# Patient Record
Sex: Male | Born: 2000 | Race: White | Hispanic: No | Marital: Single | Smoking: Never smoker
Health system: Southern US, Community
[De-identification: ages and names within clinical notes are randomized; demographics above are authoritative.]

## PROBLEM LIST (undated history)

## (undated) DIAGNOSIS — J45909 Unspecified asthma, uncomplicated: Secondary | ICD-10-CM

---
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
## 2020-01-12 ENCOUNTER — Ambulatory Visit: Payer: Self-pay | Attending: Internal Medicine

## 2020-01-12 DIAGNOSIS — Z23 Encounter for immunization: Secondary | ICD-10-CM

## 2020-01-12 NOTE — Progress Notes (Signed)
   Covid-19 Vaccination Clinic  Name:  Edwin Li    MRN: [id number] DOB: Jul 08, 2001  01/12/2020  Mr. Edwin Li was observed post Covid-19 immunization for 15 minutes without incident. He was provided with Vaccine Information Sheet and instruction to access the V-Safe system.   Mr. Edwin Li was instructed to call 911 with any severe reactions post vaccine: Marland Kitchen Difficulty breathing  . Swelling of face and throat  . A fast heartbeat  . A bad rash all over body  . Dizziness and weakness   Immunizations Administered    Name Date Dose VIS Date Route   Pfizer COVID-19 Vaccine 01/12/2020  5:59 PM 0.3 mL 09/29/2019 Intramuscular   Manufacturer: [employer], [employer]   Lot: [id number]   NDC: [address]-8

## 2020-02-02 ENCOUNTER — Ambulatory Visit: Payer: Self-pay | Attending: Internal Medicine

## 2020-02-02 DIAGNOSIS — Z23 Encounter for immunization: Secondary | ICD-10-CM

## 2020-02-02 NOTE — Progress Notes (Signed)
   Covid-19 Vaccination Clinic  Name:  JAVARI BUFKIN    MRN: [id number] DOB: 05/27/2001  02/02/2020  Mr. Segundo was observed post Covid-19 immunization for 15 minutes without incident. He was provided with Vaccine Information Sheet and instruction to access the V-Safe system.   Mr. Cato was instructed to call 911 with any severe reactions post vaccine: Marland Kitchen Difficulty breathing  . Swelling of face and throat  . A fast heartbeat  . A bad rash all over body  . Dizziness and weakness   Immunizations Administered    Name Date Dose VIS Date Route   Pfizer COVID-19 Vaccine 02/02/2020  4:47 PM 0.3 mL 09/29/2019 Intramuscular   Manufacturer: [employer], [employer]   Lot: [id number]   NDC: [address]-7

## 2022-09-18 ENCOUNTER — Emergency Department: Payer: 59

## 2022-09-18 ENCOUNTER — Other Ambulatory Visit: Payer: Self-pay

## 2022-09-18 ENCOUNTER — Emergency Department
Admission: EM | Admit: 2022-09-18 | Discharge: 2022-09-18 | Disposition: A | Discharge status: Home / Self Care | Payer: 59 | Attending: Emergency Medicine | Admitting: Emergency Medicine

## 2022-09-18 DIAGNOSIS — X501XXA Overexertion from prolonged static or awkward postures, initial encounter: Secondary | ICD-10-CM | POA: Diagnosis not present

## 2022-09-18 DIAGNOSIS — J45909 Unspecified asthma, uncomplicated: Secondary | ICD-10-CM | POA: Insufficient documentation

## 2022-09-18 DIAGNOSIS — S93402A Sprain of unspecified ligament of left ankle, initial encounter: Secondary | ICD-10-CM | POA: Diagnosis not present

## 2022-09-18 DIAGNOSIS — Y9367 Activity, basketball: Secondary | ICD-10-CM | POA: Insufficient documentation

## 2022-09-18 DIAGNOSIS — S99912A Unspecified injury of left ankle, initial encounter: Secondary | ICD-10-CM | POA: Diagnosis present

## 2022-09-18 MED ORDER — IBUPROFEN 400 MG PO TABS
400.0000 mg | ORAL_TABLET | Freq: Once | ORAL | Status: DC
  Filled 2022-09-18: qty 1

## 2022-09-18 NOTE — ED Provider Notes (Signed)
[facility] Provider Note    Event Date/Time   First MD Initiated Contact with Patient 09/18/22 0413     (approximate)   History   Ankle Pain   HPI  Leray EMMET MESSER is a 21 y.o. male past medical history of asthma presents with left ankle injury.  Patient was playing basketball yesterday and twisted the ankle.  Initially was pain-free and able to ambulate but overnight started having worsening pain.  Took ibuprofen for it.  Still able to ambulate.  Denies numbness tingling weakness no other injury.     Past Medical History:  Diagnosis Date   Asthma     There are no problems to display for this patient.    Physical Exam  Triage Vital Signs: ED Triage Vitals  Enc Vitals Group     BP 09/18/22 0053 123/72     Pulse Rate 09/18/22 0053 100     Resp 09/18/22 0053 14     Temp 09/18/22 0053 98.4 F (36.9 C)     Temp Source 09/18/22 0053 Oral     SpO2 09/18/22 0053 100 %     Weight 09/18/22 0049 235 lb (106.6 kg)     Height 09/18/22 0049 5\' 10"  (1.778 m)     Head Circumference --      Peak Flow --      Pain Score 09/18/22 0049 6     Pain Loc --      Pain Edu? --      Excl. in GC? --     Most recent vital signs: Vitals:   09/18/22 0053 09/18/22 0529  BP: 123/72 119/69  Pulse: 100 85  Resp: 14 16  Temp: 98.4 F (36.9 C)   SpO2: 100% 99%     General: Awake, no distress.  CV:  Good peripheral perfusion.  Resp:  Normal effort.  Abd:  No distention.  Neuro:             Awake, Alert, Oriented x 3  Other:  No significant swelling or deformity of the left ankle, no bony tenderness, able to range, 2+ DP pulse   ED Results / Procedures / Treatments  Labs (all labs ordered are listed, but only abnormal results are displayed) Labs Reviewed - No data to display   EKG     RADIOLOGY Reviewed and interpreted the left ankle x-ray this is negative for fracture   PROCEDURES:  Critical Care performed: No  Procedures   MEDICATIONS  ORDERED IN ED: Medications  ibuprofen (ADVIL) tablet 400 mg (400 mg Oral Patient Refused/Not Given 09/18/22 0531)     IMPRESSION / MDM / ASSESSMENT AND PLAN / ED COURSE  I reviewed the triage vital signs and the nursing notes.                              Patient's presentation is most consistent with acute, uncomplicated illness.  Differential diagnosis includes, but is not limited to, ankle sprain, ankle fracture, ligamentous injury  Patient is a 21 year old male presents with a left ankle injury.  Twisted left ankle when playing basketball yesterday.  Initially was able to ambulate now having worsening pain.  Exam is reassuring there is no focal bony tenderness no swelling or deformities able to range it he is neurovascular intact.  X-ray obtained is negative for fracture.  Since that ankle sprain.  Discussed RICE.  He is appropriate for discharge.  FINAL CLINICAL IMPRESSION(S) / ED DIAGNOSES   Final diagnoses:  Sprain of left ankle, unspecified ligament, initial encounter     Rx / DC Orders   ED Discharge Orders     None        Note:  This document was prepared using Dragon voice recognition software and may include unintentional dictation errors.   Georga Hacking, MD 09/18/22 [phone]

## 2022-09-18 NOTE — ED Triage Notes (Signed)
Pt comes from home via POV c/o left ankle injury. Pt states he twisted it while playing basketball today. Left ankle is swollen. NAD at this time.

## 2022-11-20 ENCOUNTER — Encounter: Payer: Self-pay | Admitting: Internal Medicine

## 2022-11-23 ENCOUNTER — Other Ambulatory Visit: Payer: Self-pay | Admitting: Internal Medicine

## 2022-11-23 DIAGNOSIS — R748 Abnormal levels of other serum enzymes: Secondary | ICD-10-CM

## 2022-11-25 ENCOUNTER — Other Ambulatory Visit: Payer: 59

## 2022-12-02 ENCOUNTER — Other Ambulatory Visit: Payer: 59

## 2022-12-02 ENCOUNTER — Ambulatory Visit (INDEPENDENT_AMBULATORY_CARE_PROVIDER_SITE_OTHER): Payer: 59

## 2022-12-02 DIAGNOSIS — R748 Abnormal levels of other serum enzymes: Secondary | ICD-10-CM | POA: Diagnosis not present

## 2023-02-15 ENCOUNTER — Ambulatory Visit: Payer: 59 | Admitting: Internal Medicine

## 2023-02-15 ENCOUNTER — Encounter: Payer: Self-pay | Admitting: Internal Medicine

## 2023-02-15 ENCOUNTER — Other Ambulatory Visit: Payer: Self-pay | Admitting: Internal Medicine

## 2023-02-15 VITALS — BP 114/82 | HR 113 | Ht 70.0 in | Wt 244.0 lb

## 2023-02-15 DIAGNOSIS — E782 Mixed hyperlipidemia: Secondary | ICD-10-CM | POA: Diagnosis not present

## 2023-02-15 DIAGNOSIS — E6609 Other obesity due to excess calories: Secondary | ICD-10-CM | POA: Insufficient documentation

## 2023-02-15 DIAGNOSIS — Z6836 Body mass index (BMI) 36.0-36.9, adult: Secondary | ICD-10-CM

## 2023-02-15 DIAGNOSIS — R748 Abnormal levels of other serum enzymes: Secondary | ICD-10-CM | POA: Insufficient documentation

## 2023-02-15 NOTE — Progress Notes (Signed)
Established Patient Office Visit  Subjective:  Patient ID: Edwin Li, male    DOB: Mar 14, 2001  Age: 22 y.o. MRN: [id number]  Chief Complaint  Patient presents with   Follow-up    3 month follow up, discuss lab results.    No new complaints, here for lab review  but failed to have previsit labs done.      No other concerns at this time.   Past Medical History:  Diagnosis Date   Asthma     No past surgical history on file.  Social History   Socioeconomic History   Marital status: Single    Spouse name: Not on file   Number of children: Not on file   Years of education: Not on file   Highest education level: Not on file  Occupational History   Not on file  Tobacco Use   Smoking status: Never   Smokeless tobacco: Not on file  Substance and Sexual Activity   Alcohol use: No   Drug use: Not on file   Sexual activity: Not on file  Other Topics Concern   Not on file  Social History Narrative   Not on file   Social Determinants of Health   Financial Resource Strain: Not on file  Food Insecurity: Not on file  Transportation Needs: Not on file  Physical Activity: Not on file  Stress: Not on file  Social Connections: Not on file  Intimate Partner Violence: Not on file    No family history on file.  No Known Allergies  Review of Systems  Constitutional: Negative.   HENT: Negative.    Eyes: Negative.   Respiratory: Negative.    Cardiovascular: Negative.   Gastrointestinal: Negative.   Genitourinary: Negative.   Skin: Negative.   Neurological: Negative.   Endo/Heme/Allergies: Negative.        Objective:   BP 114/82   Pulse (!) 113   Ht 5\' 10"  (1.778 m)   Wt 244 lb (110.7 kg)   SpO2 97%   BMI 35.01 kg/m   Vitals:   02/15/23 1108  BP: 114/82  Pulse: (!) 113  Height: 5\' 10"  (1.778 m)  Weight: 244 lb (110.7 kg)  SpO2: 97%  BMI (Calculated): 35.01    Physical Exam Vitals reviewed.  Constitutional:      Appearance: Normal  appearance. He is obese.  HENT:     Head: Normocephalic.     Left Ear: There is no impacted cerumen.     Nose: Nose normal.     Mouth/Throat:     Mouth: Mucous membranes are moist.     Pharynx: No posterior oropharyngeal erythema.  Eyes:     Extraocular Movements: Extraocular movements intact.     Pupils: Pupils are equal, round, and reactive to light.  Cardiovascular:     Rate and Rhythm: Regular rhythm. Tachycardia present.     Chest Wall: PMI is not displaced.     Pulses: Normal pulses.     Heart sounds: Normal heart sounds. No murmur heard. Pulmonary:     Effort: Pulmonary effort is normal.     Breath sounds: Normal air entry. No rhonchi or rales.  Abdominal:     General: Abdomen is flat. Bowel sounds are normal. There is no distension.     Palpations: Abdomen is soft. There is no hepatomegaly, splenomegaly or mass.     Tenderness: There is no abdominal tenderness.  Musculoskeletal:        General: Normal range of motion.  Cervical back: Normal range of motion and neck supple.     Right lower leg: No edema.     Left lower leg: No edema.  Skin:    General: Skin is warm and dry.  Neurological:     General: No focal deficit present.     Mental Status: He is alert and oriented to person, place, and time.     Cranial Nerves: No cranial nerve deficit.     Motor: No weakness.  Psychiatric:        Mood and Affect: Mood normal.        Behavior: Behavior normal.      No results found for any visits on 02/15/23.  No results found for this or any previous visit (from the past 2160 hour(Niccolas Loeper)).    Assessment & Plan:   Problem List Items Addressed This Visit   None   No follow-ups on file.   Total time spent: 20 minutes  Luna Fuse, MD  02/15/2023

## 2023-02-15 NOTE — Addendum Note (Signed)
Addended by: Feliberto Harts on: 02/15/2023 11:55 AM   Modules accepted: Orders

## 2023-02-16 LAB — HEPATIC FUNCTION PANEL
ALT: 45 IU/L — ABNORMAL HIGH (ref 0–44)
AST: 30 IU/L (ref 0–40)
Albumin: 4.9 g/dL (ref 4.3–5.2)
Alkaline Phosphatase: 145 IU/L — ABNORMAL HIGH (ref 44–121)
Bilirubin Total: 0.5 mg/dL (ref 0.0–1.2)
Bilirubin, Direct: 0.13 mg/dL (ref 0.00–0.40)
Total Protein: 7.6 g/dL (ref 6.0–8.5)

## 2023-02-16 LAB — FERRITIN: Ferritin: 104 ng/mL (ref 30–400)

## 2023-02-16 LAB — CERULOPLASMIN: Ceruloplasmin: 24.6 mg/dL (ref 16.0–31.0)

## 2023-04-03 ENCOUNTER — Emergency Department
Admission: EM | Admit: 2023-04-03 | Discharge: 2023-04-03 | Disposition: A | Discharge status: Home / Self Care | Payer: 59 | Attending: Emergency Medicine | Admitting: Emergency Medicine

## 2023-04-03 ENCOUNTER — Other Ambulatory Visit: Payer: Self-pay

## 2023-04-03 DIAGNOSIS — S61211A Laceration without foreign body of left index finger without damage to nail, initial encounter: Secondary | ICD-10-CM

## 2023-04-03 DIAGNOSIS — S6992XA Unspecified injury of left wrist, hand and finger(s), initial encounter: Secondary | ICD-10-CM | POA: Diagnosis present

## 2023-04-03 DIAGNOSIS — Z23 Encounter for immunization: Secondary | ICD-10-CM | POA: Diagnosis not present

## 2023-04-03 DIAGNOSIS — W293XXA Contact with powered garden and outdoor hand tools and machinery, initial encounter: Secondary | ICD-10-CM | POA: Insufficient documentation

## 2023-04-03 MED ORDER — TETANUS-DIPHTH-ACELL PERTUSSIS 5-2.5-18.5 LF-MCG/0.5 IM SUSY
0.5000 mL | PREFILLED_SYRINGE | Freq: Once | INTRAMUSCULAR | Status: AC
  Administered 2023-04-03: 0.5 mL via INTRAMUSCULAR
  Filled 2023-04-03: qty 0.5

## 2023-04-03 MED ORDER — LIDOCAINE HCL (PF) 1 % IJ SOLN
5.0000 mL | Freq: Once | INTRAMUSCULAR | Status: AC
  Administered 2023-04-03: 5 mL
  Filled 2023-04-03: qty 5

## 2023-04-03 NOTE — ED Triage Notes (Signed)
Patient states he cut left index finger with hedge trimmers

## 2023-04-03 NOTE — Discharge Instructions (Signed)
You were evaluated in the emergency department for a laceration. It was repaired with sutures. Keep the area clean and dry.  Wash multiple times per day with soap and water.  Wear the splint during the day, and remove it when you wash your hands so it doesn't get wet. Do not go into the ocean or swimming pool.  Return to the emergency department or your primary care physician's office in 7 days for suture removal.  You can keep the wound undressed when you are hanging out in a clean environment. Return to the emergency department for:  -- Fever > 100.58F -- Increase pain in the wound -- Increase redness and swelling -- Pus coming from the wound -- Wound bleeds more than a small amount or it does not stop -- Wound edges come apart -- Severe pain -- Weakness or numbness in the affected area  Or any other new or worsening symptoms. It was a pleasure caring for you.

## 2023-04-03 NOTE — ED Provider Notes (Signed)
[facility] Provider Note    Event Date/Time   First MD Initiated Contact with Patient 04/03/23 1124     (approximate)   History   Extremity Laceration   HPI  Edwin Li is a 22 y.o. male with a past medical history of obesity who presents today for evaluation of the left index finger laceration.  Patient reports that he accidentally cut himself with hedge trimmers.  He denies numbness or tingling.  He is able to move his finger normally.  He is unsure of his last tetanus shot.  Patient Active Problem List   Diagnosis Date Noted   Abnormal liver enzymes 02/15/2023   Class 2 obesity due to excess calories without serious comorbidity with body mass index (BMI) of 36.0 to 36.9 in adult 02/15/2023          Physical Exam   Triage Vital Signs: ED Triage Vitals  Enc Vitals Group     BP 04/03/23 1122 (!) 159/121     Pulse Rate 04/03/23 1122 94     Resp 04/03/23 1122 20     Temp 04/03/23 1122 98.2 F (36.8 C)     Temp Source 04/03/23 1122 Oral     SpO2 04/03/23 1122 97 %     Weight 04/03/23 1121 250 lb (113.4 kg)     Height 04/03/23 1121 5\' 10"  (1.778 m)     Head Circumference --      Peak Flow --      Pain Score 04/03/23 1121 10     Pain Loc --      Pain Edu? --      Excl. in GC? --     Most recent vital signs: Vitals:   04/03/23 1122  BP: (!) 159/121  Pulse: 94  Resp: 20  Temp: 98.2 F (36.8 C)  SpO2: 97%    Physical Exam Vitals and nursing note reviewed.  Constitutional:      General: Awake and alert. No acute distress.    Appearance: Normal appearance. The patient is normal weight.  HENT:     Head: Normocephalic and atraumatic.     Mouth: Mucous membranes are moist.  Eyes:     General: PERRL. Normal EOMs        Right eye: No discharge.        Left eye: No discharge.     Conjunctiva/sclera: Conjunctivae normal.  Cardiovascular:     Rate and Rhythm: Normal rate and regular rhythm.     Pulses: Normal pulses.  Pulmonary:      Effort: Pulmonary effort is normal. No respiratory distress.     Breath sounds: Normal breath sounds.  Abdominal:     Abdomen is soft. There is no abdominal tenderness. No rebound or guarding. No distention. Musculoskeletal:        General: No swelling. Normal range of motion.     Cervical back: Normal range of motion and neck supple.  Left index finger with V-shaped laceration to the side of the finger with skin flap at the level of the DIP.  He is able to flex and extend at isolated DIP and PIP against resistance.  Sensation intact light touch throughout.  Normal capillary refill.  No retained foreign body noted. Skin:    General: Skin is warm and dry.     Capillary Refill: Capillary refill takes less than 2 seconds.     Findings: No rash.  Neurological:     Mental Status: The patient is  awake and alert.      ED Results / Procedures / Treatments   Labs (all labs ordered are listed, but only abnormal results are displayed) Labs Reviewed - No data to display   EKG     RADIOLOGY     PROCEDURES:  Critical Care performed:   Marland KitchenMarland KitchenLaceration Repair  Date/Time: 04/03/2023 12:19 PM  Performed by: Jackelyn Hoehn, PA-C Authorized by: Jackelyn Hoehn, PA-C   Consent:    Consent obtained:  Verbal   Consent given by:  Patient   Risks, benefits, and alternatives were discussed: yes     Risks discussed:  Need for additional repair, infection, nerve damage, pain, poor cosmetic result, poor wound healing, retained foreign body, tendon damage and vascular damage   Alternatives discussed:  No treatment Universal protocol:    Procedure explained and questions answered to patient or proxy's satisfaction: yes     Relevant documents present and verified: yes     Test results available: yes     Required blood products, implants, devices, and special equipment available: yes     Site/side marked: yes     Immediately prior to procedure, a time out was called: yes     Patient identity  confirmed:  Verbally with patient Anesthesia:    Anesthesia method:  Nerve block   Block needle gauge:  27 G   Block anesthetic:  Lidocaine 1% w/o epi   Block injection procedure:  Anatomic landmarks identified, anatomic landmarks palpated, introduced needle, negative aspiration for blood and incremental injection   Block outcome:  Anesthesia achieved Laceration details:    Location:  Finger   Finger location:  L index finger   Length (cm):  1.5 (with flap)   Depth (mm):  3 Pre-procedure details:    Preparation:  Patient was prepped and draped in usual sterile fashion Exploration:    Limited defect created (wound extended): no     Hemostasis achieved with:  Direct pressure   Imaging outcome: foreign body not noted     Wound exploration: wound explored through full range of motion and entire depth of wound visualized     Wound extent: areolar tissue not violated, fascia not violated, no foreign body, no signs of injury, no nerve damage, no tendon damage, no underlying fracture and no vascular damage     Contaminated: no   Treatment:    Area cleansed with:  Saline and soap and water   Amount of cleaning:  Extensive   Irrigation solution:  Sterile saline and tap water   Irrigation method:  Pressure wash, syringe and tap   Debridement:  None   Undermining:  None   Scar revision: no   Skin repair:    Repair method:  Sutures   Suture size:  5-0   Suture material:  Nylon   Suture technique:  Simple interrupted   Number of sutures:  4 Repair type:    Repair type:  Intermediate Post-procedure details:    Dressing:  Non-adherent dressing and splint for protection   Procedure completion:  Tolerated with difficulty    MEDICATIONS ORDERED IN ED: Medications  Tdap (BOOSTRIX) injection 0.5 mL (0.5 mLs Intramuscular Given 04/03/23 1141)  lidocaine (PF) (XYLOCAINE) 1 % injection 5 mL (5 mLs Infiltration Given 04/03/23 1147)     IMPRESSION / MDM / ASSESSMENT AND PLAN / ED COURSE  I  reviewed the triage vital signs and the nursing notes.   Differential diagnosis includes, but is not limited to, finger laceration,  tendon injury, retained foreign body.  Patient is awake and alert, hemodynamically stable and afebrile, though quite anxious in appearance.  Patient is neurovascularly intact.  Sensation intact to light touch throughout finger, do not suspect nerve injury.  Able to flex and extend at isolated DIP and PIP, do not suspect tendon injury.  No fingernail or nailbed involvement. Wound fully probed and evaluated, no retained foreign body.  He has no bony tenderness or swelling or ecchymosis to suggest fracture.  Wound was anesthetized and irrigated, closed with sutures.  Patient required multiple breaks in order to de-escalate his anxiety regarding sutures and needles.  We discussed suture care and timeline for removal.  Tdap was updated.  I discussed return precautions with patient and his significant other.  Patient was discharged in stable condition.   Patient's presentation is most consistent with acute illness / injury with system symptoms.      FINAL CLINICAL IMPRESSION(S) / ED DIAGNOSES   Final diagnoses:  Laceration of left index finger without foreign body without damage to nail, initial encounter     Rx / DC Orders   ED Discharge Orders     None        Note:  This document was prepared using Dragon voice recognition software and may include unintentional dictation errors.   Keturah Shavers 04/03/23 1305    Georga Hacking, MD 04/03/23 1620

## 2023-05-14 ENCOUNTER — Ambulatory Visit: Payer: 59 | Admitting: Internal Medicine

## 2023-05-14 VITALS — BP 150/80 | HR 84 | Ht 70.0 in | Wt 248.4 lb

## 2023-05-14 DIAGNOSIS — N644 Mastodynia: Secondary | ICD-10-CM

## 2023-05-14 DIAGNOSIS — E6609 Other obesity due to excess calories: Secondary | ICD-10-CM

## 2023-05-14 DIAGNOSIS — Z6836 Body mass index (BMI) 36.0-36.9, adult: Secondary | ICD-10-CM | POA: Diagnosis not present

## 2023-05-14 DIAGNOSIS — E782 Mixed hyperlipidemia: Secondary | ICD-10-CM

## 2023-05-14 NOTE — Progress Notes (Signed)
   Established Patient Office Visit  Subjective:  Patient ID: Edwin Li, male    DOB: [birth year]  Age: 22 y.o. MRN: [id number]  Chief Complaint  Patient presents with   Acute Visit    Pain right nipple    C/o pain in his right nipple x 1 mo, denies any pain or known trauma.    No other concerns at this time.   Past Medical History:  Diagnosis Date   Asthma     No past surgical history on file.  Social History   Socioeconomic History   Marital status: Single    Spouse name: Not on file   Number of children: Not on file   Years of education: Not on file   Highest education level: Not on file  Occupational History   Not on file  Tobacco Use   Smoking status: Never   Smokeless tobacco: Not on file  Substance and Sexual Activity   Alcohol use: No   Drug use: Not on file   Sexual activity: Not on file  Other Topics Concern   Not on file  Social History Narrative   Not on file   Social Determinants of Health   Financial Resource Strain: Not on file  Food Insecurity: Not on file  Transportation Needs: Not on file  Physical Activity: Not on file  Stress: Not on file  Social Connections: Not on file  Intimate Partner Violence: Not on file    No family history on file.  No Known Allergies  Review of Systems  Constitutional: Negative.   HENT: Negative.    Eyes: Negative.   Respiratory: Negative.    Cardiovascular: Negative.   Gastrointestinal: Negative.   Genitourinary: Negative.   Skin: Negative.   Neurological: Negative.   Endo/Heme/Allergies: Negative.        Objective:   BP (!) 150/80   Pulse 84   Ht 5\' 10"  (1.778 m)   Wt 248 lb 6.4 oz (112.7 kg)   SpO2 98%   BMI 35.64 kg/m   Vitals:   05/14/23 0924  BP: (!) 150/80  Pulse: 84  Height: 5\' 10"  (1.778 m)  Weight: 248 lb 6.4 oz (112.7 kg)  SpO2: 98%  BMI (Calculated): 35.64    Physical Exam Chest:  Breasts:    Right: Tenderness (medial periareolar area) present. No skin change  (no erythema).     Left: No skin change.      No results found for any visits on 05/14/23.      Assessment & Plan:  As per problem list  Problem List Items Addressed This Visit       Other   Class 2 obesity due to excess calories without serious comorbidity with body mass index (BMI) of 36.0 to 36.9 in adult   Other Visit Diagnoses     Mastalgia    -  Primary   Relevant Orders   Testosterone Free with SHBG   LH   FSH   TSH   Mixed hyperlipidemia           Return in about 2 weeks (around 05/28/2023) for fu with labs prior.   Total time spent: 30 minutes  Luna Fuse, MD  05/14/2023   This document may have been prepared by [facility] Voice Recognition software and as such may include unintentional dictation errors.

## 2023-05-17 ENCOUNTER — Ambulatory Visit: Payer: 59 | Admitting: Internal Medicine

## 2023-07-20 ENCOUNTER — Ambulatory Visit: Payer: 59 | Admitting: Internal Medicine

## 2023-07-20 ENCOUNTER — Other Ambulatory Visit: Payer: 59

## 2023-07-20 DIAGNOSIS — N644 Mastodynia: Secondary | ICD-10-CM

## 2023-07-21 ENCOUNTER — Other Ambulatory Visit: Payer: Self-pay | Admitting: Internal Medicine

## 2023-07-21 ENCOUNTER — Ambulatory Visit (INDEPENDENT_AMBULATORY_CARE_PROVIDER_SITE_OTHER): Payer: 59 | Admitting: Internal Medicine

## 2023-07-21 ENCOUNTER — Encounter: Payer: Self-pay | Admitting: Internal Medicine

## 2023-07-21 VITALS — BP 140/90 | HR 79 | Ht 70.0 in | Wt 252.0 lb

## 2023-07-21 DIAGNOSIS — R928 Other abnormal and inconclusive findings on diagnostic imaging of breast: Secondary | ICD-10-CM

## 2023-07-21 DIAGNOSIS — N644 Mastodynia: Secondary | ICD-10-CM | POA: Diagnosis not present

## 2023-07-21 DIAGNOSIS — E66812 Obesity, class 2: Secondary | ICD-10-CM | POA: Diagnosis not present

## 2023-07-21 DIAGNOSIS — H918X1 Other specified hearing loss, right ear: Secondary | ICD-10-CM

## 2023-07-21 DIAGNOSIS — H9190 Unspecified hearing loss, unspecified ear: Secondary | ICD-10-CM | POA: Insufficient documentation

## 2023-07-21 DIAGNOSIS — Z6836 Body mass index (BMI) 36.0-36.9, adult: Secondary | ICD-10-CM | POA: Diagnosis not present

## 2023-07-21 DIAGNOSIS — E6609 Other obesity due to excess calories: Secondary | ICD-10-CM | POA: Diagnosis not present

## 2023-07-21 LAB — FOLLICLE STIMULATING HORMONE: FSH: 3.7 m[IU]/mL (ref 1.5–12.4)

## 2023-07-21 LAB — LUTEINIZING HORMONE: LH: 6.8 m[IU]/mL (ref 1.7–8.6)

## 2023-07-21 LAB — TSH: TSH: 0.936 u[IU]/mL (ref 0.450–4.500)

## 2023-07-21 NOTE — Progress Notes (Signed)
Established Patient Office Visit  Subjective:  Patient ID: Edwin Li, male    DOB: Sep 24, 2001  Age: 22 y.o. MRN: [id number]  Chief Complaint  Patient presents with   Follow-up    Discuss ENT referral    C/o hearing loss, early hearing loss is familial and requests an ENT evaluation. Labs reviewed and notable for normal lh, fsh and tsh.    No other concerns at this time.   Past Medical History:  Diagnosis Date   Asthma     No past surgical history on file.  Social History   Socioeconomic History   Marital status: Single    Spouse name: Not on file   Number of children: Not on file   Years of education: Not on file   Highest education level: Not on file  Occupational History   Not on file  Tobacco Use   Smoking status: Never   Smokeless tobacco: Not on file  Substance and Sexual Activity   Alcohol use: No   Drug use: Not on file   Sexual activity: Not on file  Other Topics Concern   Not on file  Social History Narrative   Not on file   Social Determinants of Health   Financial Resource Strain: Not on file  Food Insecurity: Not on file  Transportation Needs: Not on file  Physical Activity: Not on file  Stress: Not on file  Social Connections: Not on file  Intimate Partner Violence: Not on file    No family history on file.  No Known Allergies  Review of Systems  Constitutional: Negative.   HENT:  Positive for hearing loss (worse on the right).   Eyes: Negative.   Respiratory: Negative.    Cardiovascular: Negative.   Gastrointestinal: Negative.   Genitourinary: Negative.   Skin: Negative.   Neurological: Negative.   Endo/Heme/Allergies: Negative.        Objective:   BP (!) 140/90   Pulse 79   Ht 5\' 10"  (1.778 m)   Wt 252 lb (114.3 kg)   SpO2 98%   BMI 36.16 kg/m   Vitals:   07/21/23 0830  BP: (!) 140/90  Pulse: 79  Height: 5\' 10"  (1.778 m)  Weight: 252 lb (114.3 kg)  SpO2: 98%  BMI (Calculated): 36.16    Physical  Exam Vitals reviewed.  Constitutional:      Appearance: Normal appearance.  HENT:     Head: Normocephalic.     Left Ear: There is no impacted cerumen.     Ears:     Weber exam findings: Lateralizes left.    Right Rinne: AC > BC.    Nose: Nose normal.     Mouth/Throat:     Mouth: Mucous membranes are moist.     Pharynx: No posterior oropharyngeal erythema.  Eyes:     Extraocular Movements: Extraocular movements intact.     Pupils: Pupils are equal, round, and reactive to light.  Cardiovascular:     Rate and Rhythm: Regular rhythm.     Chest Wall: PMI is not displaced.     Pulses: Normal pulses.     Heart sounds: Normal heart sounds. No murmur heard. Pulmonary:     Effort: Pulmonary effort is normal.     Breath sounds: Normal air entry. No rhonchi or rales.  Chest:  Breasts:    Right: Tenderness (medial periareolar area) present. No skin change (no erythema).     Left: No skin change.  Abdominal:  General: Abdomen is flat. Bowel sounds are normal. There is no distension.     Palpations: Abdomen is soft. There is no hepatomegaly, splenomegaly or mass.     Tenderness: There is no abdominal tenderness.  Musculoskeletal:        General: Normal range of motion.     Cervical back: Normal range of motion and neck supple.     Right lower leg: No edema.     Left lower leg: No edema.  Skin:    General: Skin is warm and dry.  Neurological:     General: No focal deficit present.     Mental Status: He is alert and oriented to person, place, and time.     Cranial Nerves: No cranial nerve deficit.     Motor: No weakness.  Psychiatric:        Mood and Affect: Mood normal.        Behavior: Behavior normal.      No results found for any visits on 07/21/23.  Recent Results (from the past 2160 hour(Aylen Rambert))  LH     Status: None   Collection Time: 07/20/23 11:19 AM  Result Value Ref Range   LH 6.8 1.7 - 8.6 mIU/mL  FSH     Status: None   Collection Time: 07/20/23 11:19 AM  Result  Value Ref Range   FSH 3.7 1.5 - 12.4 mIU/mL  TSH     Status: None   Collection Time: 07/20/23 11:19 AM  Result Value Ref Range   TSH 0.936 0.450 - 4.500 uIU/mL      Assessment & Plan:  As per problem list  Problem List Items Addressed This Visit       Nervous and Auditory   Hearing loss - Primary   Relevant Orders   Ambulatory referral to ENT     Other   Class 2 obesity due to excess calories without serious comorbidity with body mass index (BMI) of 36.0 to 36.9 in adult   Other Visit Diagnoses     Mastalgia       Relevant Orders   MM 3D DIAGNOSTIC MAMMOGRAM UNILATERAL RIGHT BREAST       Return in about 4 weeks (around 08/18/2023) for Mastalgia.   Total time spent: 30 minutes  Luna Fuse, MD  07/21/2023   This document may have been prepared by [facility] Voice Recognition software and as such may include unintentional dictation errors.

## 2023-07-28 ENCOUNTER — Other Ambulatory Visit
Admission: RE | Admit: 2023-07-28 | Discharge: 2023-07-28 | Disposition: A | Discharge status: Home / Self Care | Payer: 59 | Source: Ambulatory Visit | Attending: Obstetrics | Admitting: Obstetrics

## 2023-07-28 ENCOUNTER — Ambulatory Visit: Payer: 59

## 2023-07-28 DIAGNOSIS — Z8279 Family history of other congenital malformations, deformations and chromosomal abnormalities: Secondary | ICD-10-CM | POA: Insufficient documentation

## 2023-07-28 NOTE — Progress Notes (Signed)
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  Cherly Anderson, MS, CGC

## 2023-08-01 LAB — TESTOSTERONE, FREE AND TOTAL (INCLUDES SHBG)-(MALES)
% Free Testosterone: 2.5 %
Free Testosterone, S: 69 pg/mL
Sex Hormone Binding Globulin: 17.6 nmol/L
Testosterone, Serum (Total): 277 ng/dL

## 2023-08-03 NOTE — Progress Notes (Signed)
Patient notified and verbalized understanding. 

## 2023-08-10 LAB — CHROMOSOME, BLOOD, ROUTINE
Cells Analyzed: 20
Cells Counted: 20
Cells Karyotyped: 2
GTG Band Resolution Achieved: 500

## 2023-08-12 ENCOUNTER — Telehealth: Payer: Self-pay

## 2023-08-12 NOTE — Telephone Encounter (Signed)
I spoke with Edwin Li to return his karyotype results. His karyotype returned as normal 46, XY. No chromosome differences were detected by the lab. This does not exclude the possibility of subtle chromosome differences that cannot be detected by karyotyping. Please see lab report for details. Given his and his partner's Edwin Li DOB 05/30/2002) normal karyotype results, the recurrence risk for Down syndrome or other chromosome differences in future pregnancies would be around 1%.  Sheppard Plumber, MS Genetic Counselor [facility] [phone]

## 2023-08-17 ENCOUNTER — Encounter: Payer: Self-pay | Admitting: Internal Medicine

## 2023-08-17 ENCOUNTER — Ambulatory Visit: Payer: 59 | Admitting: Internal Medicine

## 2023-08-17 VITALS — BP 150/90 | HR 103 | Ht 70.0 in | Wt 252.6 lb

## 2023-08-17 DIAGNOSIS — I10 Essential (primary) hypertension: Secondary | ICD-10-CM

## 2023-08-17 DIAGNOSIS — R0789 Other chest pain: Secondary | ICD-10-CM | POA: Insufficient documentation

## 2023-08-17 DIAGNOSIS — I498 Other specified cardiac arrhythmias: Secondary | ICD-10-CM | POA: Diagnosis not present

## 2023-08-17 NOTE — Progress Notes (Signed)
Established Patient Office Visit  Subjective:  Patient ID: Edwin Li, male    DOB: 2000-11-10  Age: 22 y.o. MRN: [id number]  Chief Complaint  Patient presents with   Follow-up    Chest pain and tightness    C/o palpitations and chest tightness x 2 days. Initial onset 4 yrs ago and underwent EKG which was abnormal  but didn't undergo further testing. This is the first episode since then and denies any emotional stress.     No other concerns at this time.   Past Medical History:  Diagnosis Date   Asthma     History reviewed. No pertinent surgical history.  Social History   Socioeconomic History   Marital status: Single    Spouse name: Not on file   Number of children: Not on file   Years of education: Not on file   Highest education level: Not on file  Occupational History   Not on file  Tobacco Use   Smoking status: Never   Smokeless tobacco: Not on file  Vaping Use   Vaping status: Never Used  Substance and Sexual Activity   Alcohol use: No   Drug use: Not on file   Sexual activity: Not on file  Other Topics Concern   Not on file  Social History Narrative   Not on file   Social Determinants of Health   Financial Resource Strain: Not on file  Food Insecurity: Not on file  Transportation Needs: Not on file  Physical Activity: Not on file  Stress: Not on file  Social Connections: Not on file  Intimate Partner Violence: Not on file    Family History  Problem Relation Age of Onset   Heart disease Paternal Grandfather     No Known Allergies  Review of Systems  Respiratory:  Negative for shortness of breath.   Cardiovascular:  Positive for chest pain and palpitations. Negative for orthopnea.       Objective:   BP (!) 150/90   Pulse (!) 103   Ht 5\' 10"  (1.778 m)   Wt 252 lb 9.6 oz (114.6 kg)   SpO2 98%   BMI 36.24 kg/m   Vitals:   08/17/23 1451  BP: (!) 150/90  Pulse: (!) 103  Height: 5\' 10"  (1.778 m)  Weight: 252 lb 9.6 oz (114.6  kg)  SpO2: 98%  BMI (Calculated): 36.24    Physical Exam Vitals reviewed.  Constitutional:      Appearance: Normal appearance. He is obese.  HENT:     Head: Normocephalic.     Left Ear: There is no impacted cerumen.     Ears:     Weber exam findings: Lateralizes left.    Right Rinne: AC > BC.    Nose: Nose normal.     Mouth/Throat:     Mouth: Mucous membranes are moist.     Pharynx: No posterior oropharyngeal erythema.  Eyes:     Extraocular Movements: Extraocular movements intact.     Pupils: Pupils are equal, round, and reactive to light.  Cardiovascular:     Rate and Rhythm: Regular rhythm.     Chest Wall: PMI is not displaced.     Pulses: Normal pulses.     Heart sounds: Normal heart sounds. No murmur heard. Pulmonary:     Effort: Pulmonary effort is normal.     Breath sounds: Normal air entry. No rhonchi or rales.  Chest:  Breasts:    Right: Tenderness (medial periareolar area) present. No  skin change (no erythema).     Left: No skin change.  Abdominal:     General: Abdomen is flat. Bowel sounds are normal. There is no distension.     Palpations: Abdomen is soft. There is no hepatomegaly, splenomegaly or mass.     Tenderness: There is no abdominal tenderness.  Musculoskeletal:        General: Normal range of motion.     Cervical back: Normal range of motion and neck supple.     Right lower leg: No edema.     Left lower leg: No edema.  Skin:    General: Skin is warm and dry.  Neurological:     General: No focal deficit present.     Mental Status: He is alert and oriented to person, place, and time.     Cranial Nerves: No cranial nerve deficit.     Motor: No weakness.  Psychiatric:        Mood and Affect: Mood normal.        Behavior: Behavior normal.      No results found for any visits on 08/17/23.      Assessment & Plan:  As per problem list  Problem List Items Addressed This Visit       Other   Other chest pain - Primary   Relevant Orders    PCV ECHOCARDIOGRAM COMPLETE   Sinus arrhythmia seen on electrocardiogram    Return keep appt.   Total time spent: 30 minutes  Luna Fuse, MD  08/17/2023   This document may have been prepared by [facility] Voice Recognition software and as such may include unintentional dictation errors.

## 2023-08-25 ENCOUNTER — Encounter: Payer: Self-pay | Admitting: Internal Medicine

## 2023-08-26 ENCOUNTER — Ambulatory Visit: Payer: 59

## 2023-08-26 DIAGNOSIS — I351 Nonrheumatic aortic (valve) insufficiency: Secondary | ICD-10-CM | POA: Diagnosis not present

## 2023-08-26 DIAGNOSIS — R0789 Other chest pain: Secondary | ICD-10-CM

## 2023-08-26 DIAGNOSIS — I361 Nonrheumatic tricuspid (valve) insufficiency: Secondary | ICD-10-CM | POA: Diagnosis not present

## 2023-08-30 ENCOUNTER — Encounter: Payer: Self-pay | Admitting: Internal Medicine

## 2023-08-30 ENCOUNTER — Ambulatory Visit (INDEPENDENT_AMBULATORY_CARE_PROVIDER_SITE_OTHER): Payer: 59 | Admitting: Internal Medicine

## 2023-08-30 VITALS — BP 148/87 | HR 74 | Ht 71.0 in | Wt 253.2 lb

## 2023-08-30 DIAGNOSIS — N644 Mastodynia: Secondary | ICD-10-CM

## 2023-08-30 DIAGNOSIS — R0789 Other chest pain: Secondary | ICD-10-CM

## 2023-08-30 DIAGNOSIS — E66812 Obesity, class 2: Secondary | ICD-10-CM

## 2023-08-30 DIAGNOSIS — G4739 Other sleep apnea: Secondary | ICD-10-CM

## 2023-08-30 DIAGNOSIS — Z6836 Body mass index (BMI) 36.0-36.9, adult: Secondary | ICD-10-CM

## 2023-08-30 DIAGNOSIS — E6609 Other obesity due to excess calories: Secondary | ICD-10-CM

## 2023-08-30 NOTE — Progress Notes (Signed)
Established Patient Office Visit  Subjective:  Patient ID: Edwin Li, male    DOB: [birth year]  Age: 22 y.o. MRN: [id number]  Chief Complaint  Patient presents with   Follow-up    No new complaints or further chest pain, here for echo results. EKG nsr and echo only confirmed mild LA enlargement. His partner has witnessed apneic spells while he is asleep     No other concerns at this time.   Past Medical History:  Diagnosis Date   Asthma     No past surgical history on file.  Social History   Socioeconomic History   Marital status: Single    Spouse name: Not on file   Number of children: Not on file   Years of education: Not on file   Highest education level: Not on file  Occupational History   Not on file  Tobacco Use   Smoking status: Never   Smokeless tobacco: Not on file  Vaping Use   Vaping status: Never Used  Substance and Sexual Activity   Alcohol use: No   Drug use: Not on file   Sexual activity: Not on file  Other Topics Concern   Not on file  Social History Narrative   Not on file   Social Determinants of Health   Financial Resource Strain: Not on file  Food Insecurity: Not on file  Transportation Needs: Not on file  Physical Activity: Not on file  Stress: Not on file  Social Connections: Not on file  Intimate Partner Violence: Not on file    Family History  Problem Relation Age of Onset   Heart disease Paternal Grandfather     No Known Allergies  Review of Systems  Respiratory:  Negative for shortness of breath.   Cardiovascular:  Positive for chest pain and palpitations. Negative for orthopnea.  All other systems reviewed and are negative.      Objective:   BP (!) 148/87   Pulse 74   Ht 5\' 11"  (1.803 m)   Wt 253 lb 3.2 oz (114.9 kg)   SpO2 97%   BMI 35.31 kg/m   Vitals:   08/30/23 0942  BP: (!) 148/87  Pulse: 74  Height: 5\' 11"  (1.803 m)  Weight: 253 lb 3.2 oz (114.9 kg)  SpO2: 97%  BMI (Calculated): 35.33     Physical Exam Vitals reviewed.  Constitutional:      Appearance: Normal appearance. He is obese.  HENT:     Head: Normocephalic.     Left Ear: There is no impacted cerumen.     Ears:     Weber exam findings: Lateralizes left.    Right Rinne: AC > BC.    Nose: Nose normal.     Mouth/Throat:     Mouth: Mucous membranes are moist.     Pharynx: No posterior oropharyngeal erythema.  Eyes:     Extraocular Movements: Extraocular movements intact.     Pupils: Pupils are equal, round, and reactive to light.  Cardiovascular:     Rate and Rhythm: Regular rhythm.     Chest Wall: PMI is not displaced.     Pulses: Normal pulses.     Heart sounds: Normal heart sounds. No murmur heard. Pulmonary:     Effort: Pulmonary effort is normal.     Breath sounds: Normal air entry. No rhonchi or rales.  Chest:  Breasts:    Right: Tenderness (medial periareolar area) present. No skin change (no erythema).     Left:  No skin change.  Abdominal:     General: Abdomen is flat. Bowel sounds are normal. There is no distension.     Palpations: Abdomen is soft. There is no hepatomegaly, splenomegaly or mass.     Tenderness: There is no abdominal tenderness.  Musculoskeletal:        General: Normal range of motion.     Cervical back: Normal range of motion and neck supple.     Right lower leg: No edema.     Left lower leg: No edema.  Skin:    General: Skin is warm and dry.  Neurological:     General: No focal deficit present.     Mental Status: He is alert and oriented to person, place, and time.     Cranial Nerves: No cranial nerve deficit.     Motor: No weakness.  Psychiatric:        Mood and Affect: Mood normal.        Behavior: Behavior normal.      No results found for any visits on 08/30/23.  Recent Results (from the past 2160 hour(Miquel Stacks))  Testosterone Free with SHBG     Status: None   Collection Time: 07/20/23  9:19 AM  Result Value Ref Range   Testosterone, Serum (Total) 277 ng/dL     Comment: This test was developed and its performance characteristics determined by [facility]. It has not been cleared or approved by the Food and Drug Administration. Reference Range: Adult Males >18 years    62 - 916 This [facility] LC/MS-MS method is currently certified by the [facility] Hormone Standardization Program (HoST).  Adult male reference interval is based on a population of healthy nonobese males (BMI <30) between 49 and 40 years old. Mardee Postin 1610,960;4540-9811 PMID: [id number].    % Free Testosterone 2.5 %    Comment: This test was developed and its performance characteristics determined by [facility]. It has not been cleared or approved by the Food and Drug Administration. Reference Range: Adult Males: 1.5 - 3.2    Free Testosterone, Farah Benish 69 pg/mL    Comment: Reference Range: Adult Males: 55 - 280    Sex Hormone Binding Globulin 17.6 nmol/L    Comment: Reference Range: Pubertal: 16.0 - 100.0 20 - 49y: 16.5 - 55.9 >49y:     19.3 - 76.4   LH     Status: None   Collection Time: 07/20/23 11:19 AM  Result Value Ref Range   LH 6.8 1.7 - 8.6 mIU/mL  FSH     Status: None   Collection Time: 07/20/23 11:19 AM  Result Value Ref Range   FSH 3.7 1.5 - 12.4 mIU/mL  TSH     Status: None   Collection Time: 07/20/23 11:19 AM  Result Value Ref Range   TSH 0.936 0.450 - 4.500 uIU/mL  Chromosome, Blood, Routine     Status: None   Collection Time: 07/28/23 10:45 AM  Result Value Ref Range   Specimen Type Comment:     Comment: BLOOD   Cells Counted 20    Cells Analyzed 20    Cells Karyotyped 2    GTG Band Resolution Achieved 500    Cytogenetic Result Comment:     Comment: 46,XY   Interpretation Comment:     Comment: (NOTE) NORMAL MALE KARYOTYPE    Cytogenetic analysis of PHA stimulated cultures has revealed a MALE karyotype with an apparently normal GTG banding pattern in all cells observed.      This result  does not exclude the possibility of subtle rearrangements  below the resolution of cytogenetics or congenital anomalies due to other etiologies.    Technical Component-Partial chromosome analysis performed at [address], [place], [place] [address], [facility] CLIA [id number].  Medical Director, Maurine Simmering, M.D., Ph.D.    Technical Component-Processing performed at [address], [place], [place] [address], [facility] CLIA [id number].  Medical Director, Maurine Simmering, M.D., Ph.D.    Technical Component-Partial chromosome analysis performed at [address] [place], [place] [address], [facility] CLIA [id number].  Medical Director, Maurine Simmering, M.D., Ph.D.    Director Review: Comment:     Comment: (NOTE) Albertina Senegal. Christell Constant, PhD, [facility] Performed At: [facility] [address] [place], [place] [id number] Maurine Simmering MDPhD [id number]:[id number]       Assessment & Plan:   Problem List Items Addressed This Visit       Other   Class 2 obesity due to excess calories without serious comorbidity with body mass index (BMI) of 36.0 to 36.9 in adult   Other chest pain   Other Visit Diagnoses     Other sleep apnea    -  Primary   Relevant Orders   Ambulatory referral to Sleep Studies   Mastalgia           Return in about 2 months (around 10/30/2023) for palpitations and OSA f/u.   Total time spent: 30 minutes  Luna Fuse, MD  08/30/2023   This document may have been prepared by [facility] Voice Recognition software and as such may include unintentional dictation errors.

## 2023-09-08 ENCOUNTER — Ambulatory Visit
Admission: RE | Admit: 2023-09-08 | Discharge: 2023-09-08 | Disposition: A | Discharge status: Home / Self Care | Payer: 59 | Source: Ambulatory Visit | Attending: Internal Medicine | Admitting: Internal Medicine

## 2023-09-08 DIAGNOSIS — N644 Mastodynia: Secondary | ICD-10-CM | POA: Diagnosis present

## 2023-09-20 ENCOUNTER — Other Ambulatory Visit: Payer: Self-pay | Admitting: Internal Medicine

## 2023-09-20 ENCOUNTER — Encounter: Payer: Self-pay | Admitting: Internal Medicine

## 2023-09-20 DIAGNOSIS — N644 Mastodynia: Secondary | ICD-10-CM

## 2023-09-20 MED ORDER — TAMOXIFEN CITRATE 20 MG PO TABS: 20.0000 mg | ORAL_TABLET | Freq: Every day | ORAL | 0 refills | Status: AC

## 2023-09-21 NOTE — Progress Notes (Signed)
Patient notified

## 2023-11-01 ENCOUNTER — Ambulatory Visit: Payer: 59 | Admitting: Internal Medicine
